# Patient Record
Sex: Male | Born: 2000 | Race: White | Hispanic: No | Marital: Single | Smoking: Never smoker
Health system: Southern US, Community
[De-identification: ages and names within clinical notes are randomized; demographics above are authoritative.]

## PROBLEM LIST (undated history)

## (undated) DIAGNOSIS — Z789 Other specified health status: Secondary | ICD-10-CM

## (undated) HISTORY — DX: Other specified health status: Z78.9

---
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
## 2022-01-02 ENCOUNTER — Telehealth: Payer: Self-pay

## 2022-01-02 NOTE — Telephone Encounter (Signed)
Good afternoon, Dad called in stating he had been in communication with you about an initial ADHD appt with [place] as a np. He says you left him a message offering him an appt in March. He says he left a few vm to say he wants that  appt but never heard back and wanted to make sure that the appt was actually made. I didn't see the appt or any encounter about his referral so I told him I would pass the message to you. Dad's number is [phone]. Thank you!

## 2022-02-23 ENCOUNTER — Ambulatory Visit (INDEPENDENT_AMBULATORY_CARE_PROVIDER_SITE_OTHER): Payer: Commercial Managed Care - PPO | Admitting: Pediatrics

## 2022-02-23 ENCOUNTER — Telehealth: Payer: Self-pay

## 2022-02-23 ENCOUNTER — Ambulatory Visit (INDEPENDENT_AMBULATORY_CARE_PROVIDER_SITE_OTHER): Payer: Commercial Managed Care - PPO | Admitting: Licensed Clinical Social Worker

## 2022-02-23 ENCOUNTER — Other Ambulatory Visit: Payer: Self-pay

## 2022-02-23 ENCOUNTER — Other Ambulatory Visit: Payer: Self-pay | Admitting: Pediatrics

## 2022-02-23 ENCOUNTER — Other Ambulatory Visit (HOSPITAL_COMMUNITY)
Admission: RE | Admit: 2022-02-23 | Discharge: 2022-02-23 | Disposition: A | Discharge status: Home / Self Care | Payer: Commercial Managed Care - PPO | Source: Ambulatory Visit | Attending: Pediatrics | Admitting: Pediatrics

## 2022-02-23 VITALS — BP 120/78 | HR 82 | Ht 70.47 in | Wt 206.4 lb

## 2022-02-23 DIAGNOSIS — G479 Sleep disorder, unspecified: Secondary | ICD-10-CM | POA: Insufficient documentation

## 2022-02-23 DIAGNOSIS — E559 Vitamin D deficiency, unspecified: Secondary | ICD-10-CM

## 2022-02-23 DIAGNOSIS — Z113 Encounter for screening for infections with a predominantly sexual mode of transmission: Secondary | ICD-10-CM | POA: Insufficient documentation

## 2022-02-23 DIAGNOSIS — F341 Dysthymic disorder: Secondary | ICD-10-CM

## 2022-02-23 DIAGNOSIS — R69 Illness, unspecified: Secondary | ICD-10-CM

## 2022-02-23 DIAGNOSIS — R400 Somnolence: Secondary | ICD-10-CM | POA: Insufficient documentation

## 2022-02-23 MED ORDER — ESCITALOPRAM OXALATE 10 MG PO TABS: 10.0000 mg | ORAL_TABLET | Freq: Every day | ORAL | 0 refills | Status: DC

## 2022-02-23 NOTE — Patient Instructions (Addendum)
Start lexapro 10 mg daily  ?Let us know if something isn't going well  ?Last 4 of social would be all 8s [id number]) if it asks you for that  ?

## 2022-02-23 NOTE — BH Specialist Note (Signed)
?Integrated Behavioral Health Initial In-Person Visit ? ?MRN: [id number] ?Name: Adam Humphrey ? ?Number of Integrated Behavioral Health Clinician visits: 1- Initial Visit ? ?Session Start time: 1022 ?   ?Session End time: 1037 ? ?Total time in minutes: 15 ? ? ?Types of Service: General Behavioral Integrated Care (BHI) no charge due to length of appointment  ? ?Interpretor:No. Interpretor Name and Language: n/a ? ? Warm Hand Off Completed. ?  ? ?Subjective: ?Raeshaun Simson is a 21 y.o. male accompanied by Father, attended majority of appointment alone ?Patient was referred by self for ADHD evaluation. ?Patient reports the following symptoms/concerns: wakes tired after full night's sleep, trouble staying focused even on things he likes, trouble remembering, brain fog, father has mentioned that PGF may have ADHD (no known diagnosis), bothered by things for a while  ?Duration of problem: months; Severity of problem: moderate ? ?Objective: ?Mood: Euthymic and Affect: Appropriate ?Risk of harm to self or others: No plan to harm self or others ? 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 ?Bio-Psycho Social History: ? ?Health habits: ?Sleep:sleeps around 9 hours, waking up tired ?Eating habits/patterns: two meals a day, snacks ?Water intake: 2-3 32 oz bottles  ?Screen time: around five hours  ?Exercise: work out everyday, lifting weights  ? ?Gender identity: Male ?Sex assigned at birth: Male ?Pronouns: he  ?Tobacco?  no ?Drugs/ETOH?  no ?Partner preference?  male girlfriend of four years ?Sexually Active?  no  ?Pregnancy Prevention:  none ?Reviewed condoms:  yes ?Reviewed EC:  yes  ? ?History or current traumatic events (natural disaster, house fire, etc.)? no ?History or current physical trauma?   no ?History or current emotional trauma?  no ?History or current sexual trauma?  no ?History or current domestic or intimate partner violence?  no ?History of bullying:  kind of, middle school  ? ?Trusted adult at home/school:  yes ?Feels safe at home:  yes ?Trusted friends:  yes ?Feels safe at school:  yes ? ?Suicidal or homicidal thoughts?   no ?Self injurious behaviors?  no ?Auditory or Visual Disturbances/Hallucinations?   no ?Guns in the home?  no ? ?Previous or Current Psychotherapy/Treatments ? ?None  ?Patient and/or Family's Strengths/Protective Factors: ?Social connections, Concrete supports in place (healthy food, safe environments, etc.), and Physical Health (exercise, healthy diet, medication compliance, etc.) ? ?Goals Addressed: ?Patient will: ?Demonstrate ability to: Increase adequate support systems for patient/family through attending follow up appointments with adolescent medicine as needed ? ?Progress towards Goals: ?Ongoing ? ?Interventions: ?Interventions utilized: Psychoeducation and/or Health Education and Supportive Reflection  ?Standardized Assessments completed:  SNAP IV, ASRS, and PHQ-SADS completed by patient and father following BH appointment  ? ?Patient and/or Family Response: Patient discussed concerns with attention and tiredness with [facility]. Patient asked questions about diagnosis and mood, and reported feeling he thinks about certain events (like making a bad grade) longer than he'd like. Patient completed screeners.  ? ?Patient Centered Plan: ?Patient is on the following Treatment Plan(s):  Adjustments  ? ?Assessment: ?Patient currently experiencing tiredness and difficulty concentrating. ?  ?Patient may benefit from continued support of adolescent medicine clinic for medication management. ? ?Plan: ?Follow up with behavioral health clinician on : No follow up scheduled ?Behavioral recommendations: Take any medications as prescribed, call our clinic (or MyChart message) for  non-emergent questions/concerns at [phone] ?Referral(s):  No referrals needed at this time  ?"From scale of 1-10, how likely are you to follow plan?": Patient agreeable to above plan  ? ?Carleene Overlie, [facility] ? ? ? ? ? ? ? ? ?

## 2022-02-23 NOTE — Telephone Encounter (Signed)
Please call dad or patient, dad states Provider was going to send meds to the pharmacy. ?Please call dad for more information. ?[phone] ?

## 2022-02-23 NOTE — Progress Notes (Signed)
THIS RECORD MAY CONTAIN CONFIDENTIAL INFORMATION THAT SHOULD NOT BE RELEASED WITHOUT REVIEW OF THE SERVICE PROVIDER.  Adolescent Medicine Consultation Initial Visit Adam Humphrey  is a 21 y.o. male referred by No ref. provider found here today for evaluation of mood changes, concern for ADHD.    Supervising Physician: Dr. Delorse Lek    Review of records?  yes  Pertinent Labs? No  Growth Chart Viewed? yes   History was provided by the patient and father.   Chief complaint: Mood/attention concerns   HPI:   PCP Confirmed?  yes   Referred by: self    Student at [employer], undecided major.  Wakes tired  Trouble focusing, difficulty with memory. Difficulty with things he likes. Other people have noticed that his mood has been impacted- if something happens it will impact his mood for a few days like a bad grade.   Had some sleep troubles in the past. In the past few years difficulty with sleeping, not feeling well rested when he wakes. Very light sleeper, uses ear plugs to block out noise. Sometimes wakes in the night, usually easy to go back to sleep. Doesn't share a room so not sure if he snores at all. Used to take melatonin. Hasn't needed for the last month or so. For the last week has had more trouble sleeping.   Says he has a really bad memory- started noticing issues with focus junior year of high school, particularly during covid. Really hit him during college. Reads textbooks and will be there but he can't remember anything he has read. Even things he likes it is hard to pay attention. Difficulty completing multi-step directions.   Grew up in [place]. Parents divorced when he was about 42-25 years old. Shared custody, mom now lives in [place]. Pretty close with mom and dad. Bio sibs- 3 yo sister, 21 yo and 57 yo brothers. 25 yo brother with CAPD. PGF and father with likely ADHD. PGF with dyslexia, possible CAPD as well. PGF also with depression.   Born at full term but tried to  come early in the second trimester. Met milestones on time. No early issues.   Dad said his personality has flipped a lot from early on until now. He was always fun loving, outgoing. He now is more chill, mellow, introverted. Dad does notice zoning out in conversations. Dad says he struggles with the same. Dad is on lexapro, mom with depression. Mom also took lexapro and did really well on it.   PHQ-SADS Last 3 Score only 02/27/2022  PHQ-15 Score 2  Total GAD-7 Score 2  PHQ Adolescent Score 4    ASRS Top: 3/6 Bottom: 4/12  SNAP-IV 26 Question Screening  Questions 1 - 9: Inattention Subset: 8  < 13/27 = Symptoms not clinically significant 13 - 17 = Mild symptoms 18 - 22 = Moderate symptoms 23 - 27 = Severe symptoms  Questions 10 - 18: Hyperactivity/Impulsivity Subset: 0  <13/27 = Symptoms not clinically significant 13 - 17 = Mild symptoms 18 - 22 = Moderate symptoms 23 - 27 = Severe symptoms  Questions 19 - 26: Opposition/[place] Subset: 0  < 8/24 = Symptoms not clinically significant 8 - 13 = Mild symptoms 14 - 18 = Moderate symptoms 19 - 24 = Severe symptoms  Adult [facility] ADD Scales Completed on: 02/23/2022 Cluster Subtotals: Activation: 15, T-Score 68 Attention: 24, T-Score 87 Effort: 9, T-Score 58 Affect: 8, T-Score 58 Memory: 7, T-Score 58 Total Score: 63, T-Score 70  No LMP for male patient.  Not on File No current outpatient medications on file prior to visit.   No current facility-administered medications on file prior to visit.    Patient Active Problem List   Diagnosis Date Noted   Daytime somnolence 02/23/2022   Sleep disturbance 02/23/2022   Dysthymia 02/23/2022    Past Medical History:  Reviewed and updated?  yes Past Medical History:  Diagnosis Date   Medical history non-contributory     Family History: Reviewed and updated? 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   Life Context: Family and Social: In college at [employer], home for spring break lives with dad School/Work: freshman undecided leaning new media  Self-Care: alone time, talk to girlfriend Life Changes: started school, got a job 2 months ago on [employer]   Bio-Psycho Social History:   Health habits: Sleep:sleeps around 9 hours, waking up tired Eating habits/patterns: two meals a day, snacks Water intake: 2-3 32 oz bottles  Screen time: around five hours  Exercise: work out everyday, Journalist, newspaper    Gender identity: Male Sex assigned at birth: Male Pronouns: he  Tobacco?  no Drugs/ETOH?  no Partner preference?  male girlfriend of four years Sexually Active?  no  Pregnancy Prevention:  none Reviewed condoms:  yes Reviewed EC:  yes    History or current traumatic events (natural disaster, house fire, etc.)? no History or current physical trauma?  no History or current emotional trauma?  no History or current sexual trauma?  no History or current domestic or intimate partner violence?  no History of bullying:  kind of, middle school    Trusted adult at home/school:  yes Feels safe at home:  yes Trusted friends:  yes Feels safe at school:  yes   Suicidal or homicidal thoughts?   no Self injurious behaviors?  no Auditory or Visual Disturbances/Hallucinations?   no Guns in the home?  no  The following portions of the patient's history were reviewed and updated as appropriate: allergies, current medications, past family history, past medical history, past social history, past surgical history, and problem list.  Physical Exam:  Vitals:   02/23/22 0950  BP: 120/78  Pulse: 82  Weight: 206 lb 6.4 oz (93.6 kg)  Height: 5' 10.47" (1.79 m)   BP 120/78    Pulse 82     Ht 5' 10.47" (1.79 m)    Wt 206 lb 6.4 oz (93.6 kg)    BMI 29.22 kg/m  Body mass index: body mass index is 29.22 kg/m. Blood pressure percentiles are not available for patients who are 18 years or older.   Physical Exam Constitutional:      General: He is not in acute distress.    Appearance: He is well-developed.  Neck:     Thyroid: No thyromegaly.  Cardiovascular:     Rate and Rhythm: Normal rate and regular rhythm.     Heart sounds: No murmur heard. Pulmonary:     Breath sounds: Normal breath sounds.  Abdominal:     Palpations: Abdomen is soft. There is no mass.     Tenderness: There is no abdominal tenderness. There is no guarding.  Lymphadenopathy:     Cervical: No  cervical adenopathy.  Skin:    General: Skin is warm.     Findings: No rash.  Neurological:     Mental Status: He is alert.     Comments: No tremor  Psychiatric:        Mood and Affect: Mood normal. Affect is flat.     Assessment/Plan: 1. Dysthymia Labs today to eval for underlying cause. Suspect this developed during early teen years and was worsened during covid and into college transition. When we discussed dysthymia he seemed to really identify with this. Will use lexapro 10 mg daily given mom and dad have had good success with it. Clinically he does not meet criteria for ADHD.  - TSH - T4, free - VITAMIN D 25 Hydroxy (Vit-D Deficiency, Fractures) - Ferritin  2. Vitamin D deficiency Replete as necessary.  - VITAMIN D 25 Hydroxy (Vit-D Deficiency, Fractures)  3. Sleep disturbance Will monitor with initiation of lexapro.   4. Routine screening for STI (sexually transmitted infection) Per protocol.  - Urine cytology ancillary only    BH screenings:  PHQ-SADS Last 3 Score only 02/27/2022  PHQ-15 Score 2  Total GAD-7 Score 2  PHQ Adolescent Score 4    Screens performed during this visit were discussed with patient and parent and adjustments to plan made accordingly.   Follow-up:  2 weeks    I spent >60 minutes spent face to face with patient with more than 50% of appointment spent discussing diagnosis, management, follow-up, and reviewing of mood symptoms, inattention, sleep, family history. I spent an additional 15 minutes on pre-and post-visit activities.  A copy of this consultation visit was sent to: [facility], No ref. provider found

## 2022-02-24 ENCOUNTER — Encounter: Payer: Self-pay | Admitting: Pediatrics

## 2022-02-24 LAB — URINE CYTOLOGY ANCILLARY ONLY
Bacterial Vaginitis-Urine: NEGATIVE
Candida Urine: NEGATIVE
Chlamydia: NEGATIVE
Comment: NEGATIVE
Comment: NEGATIVE
Comment: NORMAL
Neisseria Gonorrhea: NEGATIVE
Trichomonas: NEGATIVE

## 2022-02-24 LAB — VITAMIN D 25 HYDROXY (VIT D DEFICIENCY, FRACTURES): Vit D, 25-Hydroxy: 20 ng/mL — ABNORMAL LOW (ref 30–100)

## 2022-02-24 LAB — TSH: TSH: 1.14 mIU/L (ref 0.50–4.30)

## 2022-02-24 LAB — FERRITIN: Ferritin: 41 ng/mL (ref 38–380)

## 2022-02-24 LAB — T4, FREE: Free T4: 1.2 ng/dL (ref 0.8–1.4)

## 2022-02-27 ENCOUNTER — Encounter: Payer: Self-pay | Admitting: Pediatrics

## 2022-03-11 ENCOUNTER — Telehealth (INDEPENDENT_AMBULATORY_CARE_PROVIDER_SITE_OTHER): Payer: Commercial Managed Care - PPO | Admitting: Pediatrics

## 2022-03-11 ENCOUNTER — Other Ambulatory Visit: Payer: Self-pay

## 2022-03-11 DIAGNOSIS — G479 Sleep disorder, unspecified: Secondary | ICD-10-CM

## 2022-03-11 DIAGNOSIS — F341 Dysthymic disorder: Secondary | ICD-10-CM

## 2022-03-11 DIAGNOSIS — R41840 Attention and concentration deficit: Secondary | ICD-10-CM

## 2022-03-11 MED ORDER — ESCITALOPRAM OXALATE 10 MG PO TABS: 15.0000 mg | ORAL_TABLET | Freq: Every day | ORAL | 3 refills | Status: DC

## 2022-03-11 NOTE — Progress Notes (Signed)
THIS RECORD MAY CONTAIN CONFIDENTIAL INFORMATION THAT SHOULD NOT BE RELEASED WITHOUT REVIEW OF THE SERVICE PROVIDER. ? ?Virtual Follow-Up Visit via Video Note ? ?I connected with Adam Humphrey 's patient  on 03/11/22 at  2:00 PM EDT by a video enabled telemedicine application and verified that I am speaking with the correct person using two identifiers.   ?Patient/parent location: Home in [place]  ?  ?I discussed the limitations of evaluation and management by telemedicine and the availability of in person appointments.  I discussed that the purpose of this telehealth visit is to provide medical care while limiting exposure to the novel coronavirus.  The patient expressed understanding and agreed to proceed. ?  ?Adam Humphrey is a 21 y.o. male referred by [facility] here today for follow-up of dysthymia, inattention and sleep disturbance . ? ?Previsit planning completed:  yes ? ? History was provided by the patient. ? ?Supervising Physician: Dr. Delorse Lek ? ?Plan from Last Visit:   ?Start lexapro 10 mg daily  ? ?Chief Complaint: ?Med f/u ? ?History of Present Illness:  ?Pt reports he has noticed things have been a little better. He can say he has seen some benefit, though not monumental. Has noticed he was working his job last week and was talking to people a little more and more outgoing. His girlfriend has also noticed a little difference. Has felt a little less stress. Concentration probably a small amount better.  ? ?Sleep for a few days was quite good but the past few days he has been getting better sleep but waking feeling more tired. ? ?Wants to know if he should eat with meds.  ? ? ?Not on File ?Outpatient Medications Prior to Visit  ?Medication Sig Dispense Refill  ? escitalopram (LEXAPRO) 10 MG tablet Take 1 tablet (10 mg total) by mouth daily. 30 tablet 0  ? ?No facility-administered medications prior to visit.  ?  ? ?Patient Active Problem List  ? Diagnosis Date Noted  ? Daytime  somnolence 02/23/2022  ? Sleep disturbance 02/23/2022  ? 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 ? ? ?The following portions of the patient's history were reviewed and updated as appropriate: allergies, current medications, past family history, past medical history, past social history, past surgical history, and problem list. ? ?Visual Observations/Objective:  ? ?General Appearance: Well nourished well developed, in no apparent distress.  ?Eyes: conjunctiva no swelling or erythema ?ENT/Mouth: No hoarseness, No cough for duration of visit.  ?Neck: Supple  ?Respiratory: Respiratory effort normal, normal rate, no retractions or distress.   ?Cardio: Appears well-perfused, noncyanotic ?Musculoskeletal: no obvious deformity ?Skin: visible skin without rashes, ecchymosis, erythema ?Neuro: Awake and oriented X 3,  ?Psych:  normal affect, Insight and Judgment appropriate.  ? ? ?Assessment/Plan: ?1. Dysthymia ?Given only small benefit with no side effects, will increase to 15 mg now and monitor for the next 4 weeks. He was in agreement.  ? ?2. Sleep disturbance ?Some improvements initially with lexapro, though these have waned some. Will monitor with increased dose.  ? ?3. Inattention ?Will monitor with dose increase. Discussed could add wellbutrin in the future as adjunct for inattention if needed.  ? ? ?I discussed the assessment and treatment plan with the patient and/or parent/guardian.  ?They were provided an opportunity to ask questions and all were answered.  ?They agreed with the plan and demonstrated an understanding of the instructions. ?They were advised to call back or seek an in-person evaluation in the emergency room if the symptoms worsen  or if the condition fails to improve as anticipated. ? ? ?Follow-up:  4 weeks via video  ? ?I spent >20 minutes spent face to face with patient with more than 50% of appointment spent discussing diagnosis, management, follow-up, and reviewing of dysthymia, sleep, inattention. I spent an  additional 5 minutes on pre-and post-visit activities. I was located in [place], [place] during this encounter. ? ? ?Alfonso Ramus, FNP  ? ? ?CC: [facility] ? ? ? ?

## 2022-04-08 ENCOUNTER — Telehealth (INDEPENDENT_AMBULATORY_CARE_PROVIDER_SITE_OTHER): Payer: Commercial Managed Care - PPO | Admitting: Pediatrics

## 2022-04-08 DIAGNOSIS — G479 Sleep disorder, unspecified: Secondary | ICD-10-CM | POA: Diagnosis not present

## 2022-04-08 DIAGNOSIS — F341 Dysthymic disorder: Secondary | ICD-10-CM

## 2022-04-08 DIAGNOSIS — R41840 Attention and concentration deficit: Secondary | ICD-10-CM

## 2022-04-08 MED ORDER — ESCITALOPRAM OXALATE 20 MG PO TABS: 20.0000 mg | ORAL_TABLET | Freq: Every day | ORAL | 3 refills | Status: DC

## 2022-04-08 NOTE — Progress Notes (Signed)
THIS RECORD MAY CONTAIN CONFIDENTIAL INFORMATION THAT SHOULD NOT BE RELEASED WITHOUT REVIEW OF THE SERVICE PROVIDER. ? ?Virtual Follow-Up Visit via Video Note ? ?I connected with Adam Humphrey 's patient  on 04/08/22 at  1:30 PM EDT by a video enabled telemedicine application and verified that I am speaking with the correct person using two identifiers.   ?Patient/parent location: [facility] in [place], [place]  ?  ?I discussed the limitations of evaluation and management by telemedicine and the availability of in person appointments.  I discussed that the purpose of this telehealth visit is to provide medical care while limiting exposure to the novel coronavirus.  The patient expressed understanding and agreed to proceed. ?  ?Adam Humphrey is a 21 y.o. male referred by [facility] here today for follow-up of inattention, dysthymia. ? ?Previsit planning completed:  yes ? ? History was provided by the patient. ? ?Supervising Physician: Dr. Delorse Lek ? ?Plan from Last Visit:   ?Increase lexapro to 15 mg daily  ? ?Chief Complaint: ?Med f/u ? ?History of Present Illness:  ?Pt reports since increase to 15 mg of lexapro things have been better. He isn't sure it has done much for the lack of focus but does feel it has helped with some day to day issues he has been having. Biggest struggles around focus are still sort of global, particularly stuff that he isn't super interested in. Denies side effects. Has been helping with going to bed earlier. Has been sleeping better through the night. Does still sometimes wake up tired but has helped. Has noticed he has been less worried/stressed than he would have in the past. He is interested in increasing the lexapro to 20 mg daily vs. Adding wellbutrin or other agent.  ? ? ?Not on File ?Outpatient Medications Prior to Visit  ?Medication Sig Dispense Refill  ? escitalopram (LEXAPRO) 10 MG tablet Take 1.5 tablets (15 mg total) by mouth daily. 45 tablet 3  ? ?No  facility-administered medications prior to visit.  ?  ? ?Patient Active Problem List  ? Diagnosis Date Noted  ? Inattention 03/11/2022  ? Daytime somnolence 02/23/2022  ? Sleep disturbance 02/23/2022  ? 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 ?The following portions of the patient's history were reviewed and updated as appropriate: allergies, current medications, past family history, past medical history, past social history, past surgical history, and problem list. ? ?Visual Observations/Objective:  ? ?General Appearance: Well nourished well developed, in no apparent distress.  ?Eyes: conjunctiva no swelling or erythema ?ENT/Mouth: No hoarseness, No cough for duration of visit.  ?Neck: Supple  ?Respiratory: Respiratory effort normal, normal rate, no retractions or distress.   ?Cardio: Appears well-perfused, noncyanotic ?Musculoskeletal: no obvious deformity ?Skin: visible skin without rashes, ecchymosis, erythema ?Neuro: Awake and oriented X 3,  ?Psych:  normal affect, Insight and Judgment appropriate.  ? ? ?Assessment/Plan: ?1. Dysthymia ?Increase lexapro to 20 mg daily per pt preference.  ?- escitalopram (LEXAPRO) 20 MG tablet; Take 1 tablet (20 mg total) by mouth daily.  Dispense: 30 tablet; Refill: 3 ? ?2. Inattention ?Will continue to monitor, discussed considering addition of wellbutrin xl 150 mg  ?- escitalopram (LEXAPRO) 20 MG tablet; Take 1 tablet (20 mg total) by mouth daily.  Dispense: 30 tablet; Refill: 3 ? ?3. Sleep disturbance ?Improved  ? ?Screens discussed with patient and parent and adjustments to plan made accordingly.  ? ?I discussed the assessment and treatment plan with the patient and/or parent/guardian.  ?They were provided an opportunity to ask  questions and all were answered.  ?They agreed with the plan and demonstrated an understanding of the instructions. ?They were advised to call back or seek an in-person evaluation in the emergency room if the symptoms worsen or if the condition fails to improve as  anticipated. ? ? ?Follow-up:  6-8 weeks pending his summer schedule  ? ?I spent >15 minutes spent face to face with patient with more than 50% of appointment spent discussing diagnosis, management, follow-up, and reviewing of inattention, dysthymia. I spent an additional 10 minutes on pre-and post-visit activities. I was located in [place], [place] during this encounter. ? ? ?Alfonso Ramus, FNP  ? ? ?CC: [facility] ? ? ? ?

## 2022-07-26 ENCOUNTER — Other Ambulatory Visit: Payer: Self-pay | Admitting: Pediatrics

## 2022-07-26 DIAGNOSIS — R41840 Attention and concentration deficit: Secondary | ICD-10-CM

## 2022-07-26 DIAGNOSIS — F341 Dysthymic disorder: Secondary | ICD-10-CM

## 2022-08-31 ENCOUNTER — Other Ambulatory Visit: Payer: Self-pay | Admitting: Family

## 2022-08-31 DIAGNOSIS — F341 Dysthymic disorder: Secondary | ICD-10-CM

## 2022-08-31 DIAGNOSIS — R41840 Attention and concentration deficit: Secondary | ICD-10-CM

## 2023-10-10 ENCOUNTER — Other Ambulatory Visit: Payer: Self-pay | Admitting: Family

## 2023-10-10 ENCOUNTER — Encounter: Payer: Self-pay | Admitting: Family

## 2023-10-10 DIAGNOSIS — R41840 Attention and concentration deficit: Secondary | ICD-10-CM

## 2023-10-10 DIAGNOSIS — F341 Dysthymic disorder: Secondary | ICD-10-CM

## 2023-10-13 ENCOUNTER — Other Ambulatory Visit: Payer: Self-pay | Admitting: Family

## 2023-10-13 ENCOUNTER — Telehealth (INDEPENDENT_AMBULATORY_CARE_PROVIDER_SITE_OTHER): Payer: Commercial Managed Care - PPO | Admitting: Family

## 2023-10-13 ENCOUNTER — Encounter: Payer: Self-pay | Admitting: Family

## 2023-10-13 DIAGNOSIS — R41840 Attention and concentration deficit: Secondary | ICD-10-CM

## 2023-10-13 DIAGNOSIS — F341 Dysthymic disorder: Secondary | ICD-10-CM

## 2023-10-13 MED ORDER — ESCITALOPRAM OXALATE 10 MG PO TABS: 10.0000 mg | ORAL_TABLET | Freq: Every day | ORAL | 0 refills | Status: DC
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
## 2024-01-03 ENCOUNTER — Other Ambulatory Visit: Payer: Self-pay | Admitting: Family

## 2024-01-03 DIAGNOSIS — R41840 Attention and concentration deficit: Secondary | ICD-10-CM

## 2024-01-03 DIAGNOSIS — F341 Dysthymic disorder: Secondary | ICD-10-CM

## 2024-01-05 MED ORDER — ESCITALOPRAM OXALATE 10 MG PO TABS: 10.0000 mg | ORAL_TABLET | Freq: Every day | ORAL | 0 refills | Status: DC

## 2024-03-27 ENCOUNTER — Other Ambulatory Visit: Payer: Self-pay | Admitting: Family

## 2024-03-27 ENCOUNTER — Telehealth

## 2024-03-27 ENCOUNTER — Telehealth: Admitting: Nurse Practitioner

## 2024-03-27 DIAGNOSIS — R41840 Attention and concentration deficit: Secondary | ICD-10-CM

## 2024-03-27 DIAGNOSIS — F341 Dysthymic disorder: Secondary | ICD-10-CM

## 2024-03-27 MED ORDER — ESCITALOPRAM OXALATE 10 MG PO TABS: 10.0000 mg | ORAL_TABLET | Freq: Every day | ORAL | 0 refills | Status: AC

## 2024-03-27 NOTE — Patient Instructions (Signed)
If you do not have a PCP,  offers a free physician referral service available at [phone]. Our trained staff has the experience, knowledge and resources to put you in touch with a physician who is right for you.  ? ?

## 2024-03-27 NOTE — Progress Notes (Signed)
 Virtual Visit Consent   Adam Humphrey, you are scheduled for a virtual visit with a [facility] provider today. Just as with appointments in the office, your consent must be obtained to participate. Your consent will be active for this visit and any virtual visit you may have with one of our providers in the next 365 days. If you have a MyChart account, a copy of this consent can be sent to you electronically.  As this is a virtual visit, video technology does not allow for your provider to perform a traditional examination. This may limit your provider's ability to fully assess your condition. If your provider identifies any concerns that need to be evaluated in person or the need to arrange testing (such as labs, EKG, etc.), we will make arrangements to do so. Although advances in technology are sophisticated, we cannot ensure that it will always work on either your end or our end. If the connection with a video visit is poor, the visit may have to be switched to a telephone visit. With either a video or telephone visit, we are not always able to ensure that we have a secure connection.  By engaging in this virtual visit, you consent to the provision of healthcare and authorize for your insurance to be billed (if applicable) for the services provided during this visit. Depending on your insurance coverage, you may receive a charge related to this service.  I need to obtain your verbal consent now. Are you willing to proceed with your visit today? Adam Humphrey has provided verbal consent on 03/27/2024 for a virtual visit (video or telephone). Viviano Simas, FNP  Date: 03/27/2024 7:25 PM   Virtual Visit via Video Note   I, Viviano Simas, connected with  Adam Humphrey  ([id number], 09/18/2001) on 03/27/24 at  7:30 PM EDT by a video-enabled telemedicine application and verified that I am speaking with the correct person using two identifiers.  Location: Patient: Virtual Visit Location Patient:  Home Provider: Virtual Visit Location Provider: Home Office   I discussed the limitations of evaluation and management by telemedicine and the availability of in person appointments. The patient expressed understanding and agreed to proceed.    History of Present Illness: Adam Humphrey is a 23 y.o. who identifies as a male who was assigned male at birth, and is being seen today for assistance in getting a refill for his Lexapro   He is currently in college in [place], [place]   Has aged out of his pediatrician / last refill was denies by practice   He has been on Lexapro for the past 2 years at a steady dosage- this has been prescribed for mood stabilization anxiety /depression   He feels that the medication has been working   Denies any SI/HI or thoughts of self harm   Will be looking for a new PCP when returning home from school this summer    Problems:  Patient Active Problem List   Diagnosis Date Noted   Inattention 03/11/2022   Daytime somnolence 02/23/2022   Sleep disturbance 02/23/2022   Dysthymia 02/23/2022    Allergies: NKDA  Medications:  Current Outpatient Medications:    escitalopram (LEXAPRO) 10 MG tablet, Take 1 tablet (10 mg total) by mouth daily., Disp: 90 tablet, Rfl: 0  Observations/Objective: Patient is well-developed, well-nourished in no acute distress.  Resting comfortably  at home.  Head is normocephalic, atraumatic.  No labored breathing.  Speech is clear and coherent with logical content.  Patient is alert  and oriented at baseline.    Assessment and Plan:   1. Dysthymia  - escitalopram (LEXAPRO) 10 MG tablet; Take 1 tablet (10 mg total) by mouth daily.  Dispense: 90 tablet; Refill: 0  Advised setting up an appointment for the summer with a PCP to get established      Follow Up Instructions: I discussed the assessment and treatment plan with the patient. The patient was provided an opportunity to ask questions and all were answered. The  patient agreed with the plan and demonstrated an understanding of the instructions.  A copy of instructions were sent to the patient via MyChart unless otherwise noted below.    The patient was advised to call back or seek an in-person evaluation if the symptoms worsen or if the condition fails to improve as anticipated.    Viviano Simas, FNP
